# Patient Record
Sex: Male | Born: 1945 | Race: White | Hispanic: No | Marital: Single | Smoking: Never smoker
Health system: Southern US, Community
[De-identification: ages and names within clinical notes are randomized; demographics above are authoritative.]

---

## 2003-06-13 ENCOUNTER — Inpatient Hospital Stay (HOSPITAL_COMMUNITY): Admission: EM | Admit: 2003-06-13 | Discharge: 2003-06-23 | Payer: Self-pay | Admitting: Emergency Medicine

## 2003-06-13 ENCOUNTER — Encounter: Payer: Self-pay | Admitting: Emergency Medicine

## 2003-06-14 ENCOUNTER — Encounter: Payer: Self-pay | Admitting: Cardiology

## 2003-06-16 ENCOUNTER — Encounter: Payer: Self-pay | Admitting: Cardiology

## 2003-06-17 ENCOUNTER — Encounter (INDEPENDENT_AMBULATORY_CARE_PROVIDER_SITE_OTHER): Payer: Self-pay | Admitting: Cardiology

## 2003-06-17 ENCOUNTER — Encounter: Payer: Self-pay | Admitting: Cardiology

## 2003-06-18 ENCOUNTER — Encounter: Payer: Self-pay | Admitting: Cardiology

## 2003-06-19 ENCOUNTER — Encounter: Payer: Self-pay | Admitting: Cardiology

## 2008-07-20 ENCOUNTER — Encounter: Admission: RE | Admit: 2008-07-20 | Discharge: 2008-07-20 | Payer: Self-pay | Admitting: Cardiology

## 2008-07-23 ENCOUNTER — Encounter: Admission: RE | Admit: 2008-07-23 | Discharge: 2008-07-23 | Payer: Self-pay | Admitting: Cardiology

## 2008-07-24 ENCOUNTER — Inpatient Hospital Stay (HOSPITAL_COMMUNITY): Admission: RE | Admit: 2008-07-24 | Discharge: 2008-07-25 | Payer: Self-pay | Admitting: Cardiology

## 2008-09-29 ENCOUNTER — Observation Stay (HOSPITAL_COMMUNITY): Admission: AD | Admit: 2008-09-29 | Discharge: 2008-09-29 | Payer: Self-pay | Admitting: Cardiology

## 2009-09-11 IMAGING — CR DG CHEST 1V
1 series · 1 of 1 positions shown · non-contrast
Comparison: July 20, 2008

CLINICAL DATA: Question left lung nodule versus nipple

CHEST - 1 VIEW

[view not recorded]
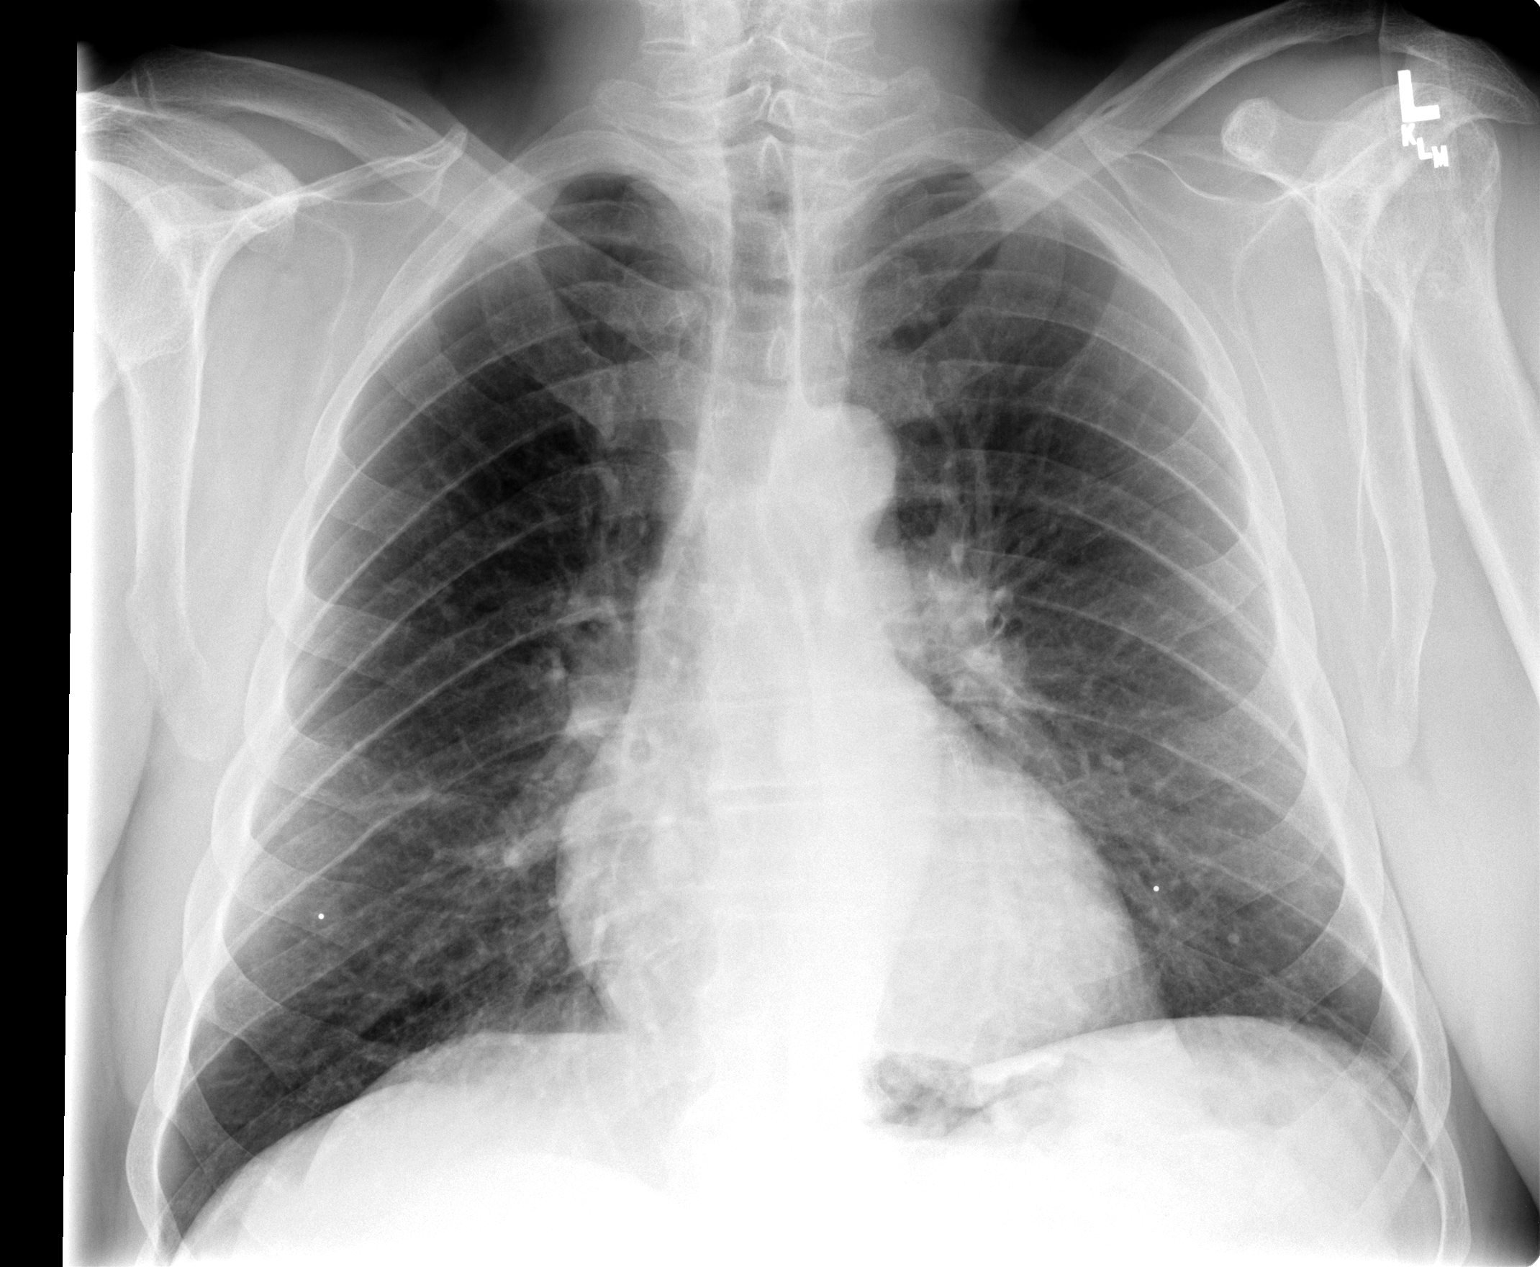

[1 of 1 positions shown; findings below may reference images not displayed]

FINDINGS: There is no evidence of persistent nodule in the lateral
left lung.  Both lungs are clear. The nipples are  marked with BBs.
The cardiac silhouette, mediastinum, pulmonary vasculature are
within normal limits.
IMPRESSION: Normal frontal chest x-ray.

## 2011-04-18 NOTE — Cardiovascular Report (Signed)
NAME:  COLSON, BARCO                   ACCOUNT NO.:  [id number]   MEDICAL RECORD NO.:  [id number]          PATIENT TYPE:  INP   LOCATION:  2899                         FACILITY:  [facility]   PHYSICIAN:  Cristy Hilts. Jacinto Halim, MD       DATE OF BIRTH:  05/11/1946   DATE OF PROCEDURE:  09/29/2008  DATE OF DISCHARGE:  09/29/2008                            CARDIAC CATHETERIZATION   PROCEDURE PERFORMED:  1. Coronary arteriography including right coronary and left coronary      arteriography.  2. Attempted percutaneous transluminal coronary angioplasty and      balloon angioplasty of chronic total occlusion of the circumflex      coronary artery.  3. Right femoral arteriography and closure of right femoral arterial      access with StarClose.   INDICATIONS:  Mr. Tymel Conely is a 65 year old gentleman with known  hypertension and hyperlipidemia who has had known coronary artery  disease.  He has had a PTCA and stenting of mid-LAD with implantation of  a 4.0 x 33 mm Velocity on June 21, 2003 and also a 3.0 x 13 mm Velocity  into the circumflex coronary artery on the same day.  He has also  undergone PTCA and stenting of right coronary artery in the distal  segment with implantation of a 4.0 x 16 mm Liberte and at the  bifurcation of the distal RCA a 3.5 x 12 mm Liberte on July 24, 2008.  He had been complaining of increasing angina pectoris and while doing it  noticed significant improvement in his angina since RCA angioplasty 2  months ago.  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  He has  known chronically totally occluded circumflex coronary artery which had  bridging collaterals and also contralateral collaterals from the RCA.  He is now brought back to the catheterization lab for a relook of his  right coronary artery anatomy and also for possible angioplasty of the  circumflex coronary artery.   ANGIOGRAPHIC DATA:  Right coronary artery:  Right coronary artery is a  large caliber vessel and  a dominant vessel.  It has got diffuse luminal  irregularity.  The previously placed stent in the mid-to-distal segment  of the right coronary artery and also the distal RCA prior to the  bifurcation of PDA and PLA are widely patent with brisk flow.  There  were type 2 collaterals noted to the distal circumflex coronary artery.   Left main coronary artery:  Left main coronary artery is mildly  calcified.   LAD:  LAD again shows mild-to-moderate calcification.  There is a  concentric 40-50% stenosis noted in the proximal LAD, which is unchanged  from cardiac catheterization in 2004.  The previously placed stent in  the mid LAD is widely patent.   Circumflex coronary artery:  Circumflex coronary artery is occluded in  its mid segment at the previously placed stent.  Distal circumflex is  supplied by collaterals from the RCA and also ipsilateral collaterals.   INTERVENTION DATA:  Unsuccessful attempt at PTCA of the CTO.  In spite  of  use of Whisper wire, Miracle Brothers 4.5, Voyager 2.0 x 12, and 1.5  x 12 balloon support for backup, I was unable to cross the CTO.  Eventually, when I did cross the CTO, I was subintimal and hence the  procedure was aborted.   There were no complications noted.  The patient remained completely  asymptomatic.  There was no staining neither was there any perforation.   RECOMMENDATIONS:  I will continue aggressive medical therapy only for  now.  If chest pain continues to persist, we can certainly attempt a  redo angioplasty again at a later date with the use of a FineCross  catheter.  However, I would prefer medical therapy at this point.  We  would also consider Ranexa therapy.   A total of 175 mL of contrast was utilized for diagnostic and  interventional procedure.   TECHNICAL PROCEDURE:  Under usual sterile precautions using a 7-French  right femoral artery access, a 6-French Multipurpose B2 catheter was  advanced to the ascending aorta and the  right coronary artery was  selectively engaged and angiography was performed.  The catheter was  then pulled out of the body and a 7-French Judkins left 4 guide catheter  was utilized, engaged in the left main coronary artery and angiography  was performed.  Using Angiomax for anticoagulation, a 190 cm x 0.014-  inch Whisper wire was utilized and the wire was carefully advanced into  the circumflex coronary artery.  After multiple attempts, I even used a  2.0 x 12 mm Voyager for backup support.  Because of side branch, I was  unable to cross the lesion and hence I used a Miracle Brothers 4.5 wire.  With great amount of difficulty, I was able to cross the lesion but I  was subintimal.  Hence, the Miracle Brothers wire was withdrawn and  again, I attempted to penetrate the CTO with the Whisper wire which was  successful, however, the tip of the wire appeared to be subintimal in  the distal circumflex coronary artery.  Hence, I aborted the procedure.  The wires were withdrawn out of the body angiographically and guide  catheter disengaged and pulled out of the body.   Right femoral arteriography was performed through the arterial access  sheath and access closed with StarClose with excellent hemostasis.  The  patient tolerated the procedure well.  No immediate complications were  noted.      Cristy Hilts. Jacinto Halim, MD  Electronically Signed     JRG/MEDQ  D:  09/29/2008  T:  09/30/2008  Job:  [id number]   cc:   Isabelle Course

## 2011-04-18 NOTE — H&P (Signed)
NAME:  Clayton Moore, Clayton Moore                   ACCOUNT NO.:  [id number]   MEDICAL RECORD NO.:  [id number]          PATIENT TYPE:  OIB   LOCATION:  2899                         FACILITY:  [facility]   PHYSICIAN:  Cristy Hilts. Jacinto Halim, MD       DATE OF BIRTH:  Sep 24, 1946   DATE OF ADMISSION:  07/24/2008  DATE OF DISCHARGE:                              HISTORY & PHYSICAL   REASON FOR VISIT:  A 65 year old patient of Dr. Verl Dicker is here today at  [facility] for elective cardiac catheterization with intervention.   HISTORY OF PRESENT ILLNESS:  A 66 year old white male with coronary  artery disease has been doing quite well with his disease until this  spring episodes of chest pressure in the middle of his chest with  exertional activity which is relieved with rest.  Does not use  nitroglycerin as is relieved with rest.  Has been constant and easily  reproducible.  No associated shortness of breath, palpitations, dyspnea  or orthopnea.  Because of these symptoms, he underwent a stress nuclear  study with Myoview and was found to have moderate ischemia in the mid  inferior lateral region.  EF was 36%.  Was considered high risk scan and  Dr. Jacinto Halim took him to [facility] for cardiac  catheterization.   Please note the patient also had a 2-D echo.  EF was mildly decreased,  by echo it was read 40-50% with severe hypokinesis of the inferior  posterior wall.   On July 15, 2008, he underwent cardiac catheterization at [facility] revealing severe progression of coronary disease.  The RCA had a  of 60-70% mid stenosis and now tandem 70% stenosis in the mid segment  and tandem stenosis in the mid segment involving the crux of the right  RCA.  There is a distal 80% focal stenosis just at the bifurcation of  the PDA and PLA.  Additionally, he has occluded circumflex artery stent  that was placed with MI in July 2004 and does have ipsilateral and  contralateral collaterals.  His previous LAD stent  was patent.  On  cardiac cath, EF was 40-45% with severe inferior and inferior lateral  hypokinesis.   The patient did well with cath and Dr. Jacinto Halim felt medical therapy for  now and would proceed with intervention to the RCA first.  If it is  successful, he plans to proceed to revascularize the circumflex coronary  artery which is stented.  If this does not work for either vessel,  either medical therapy versus bypass grafting will be recommended.   Since return home, the patient has not had any problems except for  continued cough which he had for several months now with a yellow mucus.   ALLERGIES:  No known allergies.   OUTPATIENT MEDICATIONS:  1. Metoprolol succinate 50 mg daily.  2. Urotraxal 10 mg daily.  3. Aspirin 325 daily.  4. Plavix 75 mg daily.  5. Niaspan 1000 mg 2 tablets p.o. h.s.  6. Testosterone cream three times a week.  7. Lisinopril 10 mg q.h.s.  SOCIAL HISTORY:  Divorced.  Two children, two grandchildren.  He  exercises by walking or had been two to three x a day.  Occasional  tobacco.  He has been instructed to cease tobacco use and occasional  alcohol use.   FAMILY HISTORY:  Without change from previous workups.   REVIEW OF SYSTEMS:  GENERAL:  No recent weight changes.  Has had a cough  now for several months.  Yellow sputum with negative chest x-rays.  SKIN:  Without rashes.  HEENT:  No sinus infections.  No teeth  infections.  CARDIOVASCULAR:  Chest pain as described.  PULMONARY: Cough  but no shortness of breath.  GI:  No diarrhea, constipation or melena.  GU:  No hematuria or dysuria.  MUSCULOSKELETAL:  No edema.  No painful  arthritis currently.  NEUROLOGICAL:  No syncope, no lightheadedness or  dizziness.  ENDOCRINE:  No diabetes or thyroid disease.   STUDIES:  Chest x-ray will be done August 20.  Normal frontal chest x-  ray.  Previous chest x-ray on the 17th revealed mild probable chronic  bronchitic changes, questionable nodular density  associated with a left  lung base versus nipple shadow.  So, therefore, we did a PA x-ray and  there was no evidence of nodule in the left lung.  There were nipple  markers used.  It was normal chest x-ray.  EKG with the stress test.  He  was sinus rhythm without acute changes and laboratory data on August 17.  Hemoglobin 15.2, hematocrit 42.9, WBC 5.5, platelets 195, INR/PTT were  within normal.  TSH 3.450, creatinine 1.3, BUN 14, glucose 100,  cholesterol 193, LDL 104 which is elevated, HDL 29, triglycerides 278.  UA was clear.   The patient had been on Advicor, but currently he is only on Niaspan.  In the paperwork I have, I do not have reactions to statins.  We may  need to revisit statins on this patient with significant coronary  disease, currently not on statin.  Dr. Jacinto Halim will discuss that after the  procedure.      Darcella Gasman. [place], N.P.      Cristy Hilts. Jacinto Halim, MD  Electronically Signed    LRI/MEDQ  D:  07/24/2008  T:  07/24/2008  Job:  [id number]   cc:   Cristy Hilts. Jacinto Halim, MD  Isabelle Course

## 2011-04-18 NOTE — Cardiovascular Report (Signed)
NAME:  Clayton Moore, Clayton Moore                   ACCOUNT NO.:  [id number]   MEDICAL RECORD NO.:  [id number]          PATIENT TYPE:  OIB   LOCATION:  6532                         FACILITY:  [facility]   PHYSICIAN:  Vonna Kotyk R. Jacinto Halim, MD       DATE OF BIRTH:  1946/08/20   DATE OF PROCEDURE:  07/24/2008  DATE OF DISCHARGE:                            CARDIAC CATHETERIZATION   PROCEDURE PERFORMED:  Percutaneous transluminal coronary angioplasty and  stenting of the mid and distal right coronary artery.   INDICATIONS:  Clayton Moore is a 62-year gentleman with known coronary  artery disease.  He had undergone PTCA and stenting of the circumflex  coronary artery in the setting of acute inferoposterior wall myocardial  infarction in 2004.  He also has a high-grade mid LAD stenosis which had  undergone angioplasty and stenting at the same time and with non-drug-  eluting stents.  He has been complaining of increasing angina pectoris  and had underwent stress Myoview which had revealed inferior and  inferolateral wall ischemia.  Cardiac catheterization done at [facility] about 10 days ago had revealed occluded circumflex coronary  artery but he had delayed progression of coronary artery disease in the  right coronary artery with 80% stenosis at the mid segment and a  bifurcation at PDA, PLA site of 80-90%.  He had extensive collaterals  from the right coronary artery to the circumflex coronary artery.   He also had a napkin ring-like lesion in the proximal LAD initially was  planning on doing intravascular ultrasound interrogation of this LAD.  However, on review of the angiograms in 2004 revealed no change and also  there was no anterior wall ischemia by stress Myoview, hence this lesion  was left alone.  The plan today was to proceed with a PTCA and stenting  of the right coronary artery in the hope of increasing the collateral  flow to the circumflex coronary artery with relief of angina pectoris.  This was a large dominant right coronary artery.   Right RCA.  The right coronary artery is a large caliber vessel with  mild diffuse luminal irregularity.  In the mid segment, he has 80%  stenosis which was tubular and in the distal segment, the bifurcation  had a 80% stenosis.  The PDA was small to moderate size and PLA was very  large caliber vessel.   INTERVENTION DATA:  Successful PTCA and stenting of the distal right  coronary artery after balloon angioplasty and predilatation with a 3.25  x 10-mm cutting balloon.  A 3.5 x 12 mm Liberte stent was deployed at 18  atmospheric pressure (3.9 mm) for 50 seconds each.  Post angioplasty  revealed excellent results without any jeopardy of the side branch.   Successful PTCA and stenting of the mid segment of the right coronary  artery with implantation of a 4.0 x 16 mm Liberte stent which was  deployed at 15 atmospheric pressure x2 for 50 seconds, post angiography  revealed excellent results.   RECOMMENDATIONS:  We will continue with aggressive medical therapy.  At  this point, no intervention is planned for circumflex coronary artery or  re-look at the LAD.  At the previously outpatient cardiac  catheterization, there were widely patent LAD stent.  We noticed  significant improvement in his anginal symptoms with today's procedure.  He will need Plavix for at least a period of 6 weeks, probably I will  leave him on for at least a period of 3-6 months.  He will be discharged  home in the morning if he remains stable.   A total of 160 mL of contrast was utilized for interventional procedure.  Right femoral access was closed with StarClose with excellent  hemostasis.   TECHNIQUE OF PROCEDURE:  Under usual sterile precautions using a 7-  French right femoral arterial access a 7-French JR-4 guide with side-  hole was utilized to engage the right coronary artery.  Using Angiomax  for anticoagulation and ATW guidewire, I was able to measure  the lesion  length.  I had difficulty in cannulating the PDA branch as it had acute  angulation.  I predilated the distal and also mid RCA stenosis with a  3.25 x 10-mm cutting balloon at 10 atmospheric pressure for 90 seconds  each.  Having performed this, angiography revealed a residual stenosis  and this was a tight lesion, especially at the bifurcation of the distal  RCA.  I decided to stent this with 3.5 x 12 mm Liberte stent which was  deployed at 18 atmospheric pressure and nitroglycerin was administered  and angiography was repeated.  The attention was directed towards mid  segment which was also stented with a 4.0 x 16 mm Liberte stent at 15  atmospheric pressure x2 for 50 seconds each.  Overall, the stenosis was  reduced from 80% to 0% with TIMI III to TIMI III flow maintained at the  end of the procedure.  The guidewire was withdrawn and angiography  repeated.  The guide catheter disengaged and pulled out of the body.  Right femoral arteriography was performed through the arterial access  sheath and the access closed with StarClose with excellent hemostasis.  The patient tolerated the procedure.      Cristy Hilts. Jacinto Halim, MD  Electronically Signed     JRG/MEDQ  D:  07/24/2008  T:  07/25/2008  Job:  [id number]   cc:   Emilio Aspen, MD

## 2011-04-21 NOTE — Cardiovascular Report (Signed)
NAME:  Clayton Moore, Clayton Moore                               ACCOUNT NO.:  [id number]   MEDICAL RECORD NO.:  [id number]                   PATIENT TYPE:  INP   LOCATION:  2920                                 FACILITY:  [facility]   PHYSICIAN:  Cristy Hilts. Jacinto Halim, M.D.                  DATE OF BIRTH:  06-17-1946   DATE OF PROCEDURE:  06/13/2003  DATE OF DISCHARGE:                              CARDIAC CATHETERIZATION   PROCEDURES PERFORMED:  1. Left ventriculography.  2. Selective right and left coronary arteriography.  3. Percutaneous transluminal coronary angioplasty and stenting of the     circumflex coronary artery.  4. Intracoronary nitroglycerin and intracoronary administration of     adenosine.  5. Percutaneous insertion of intra-aortic balloon pump.  6. Administration of intravenous amiodarone for nonsustained ventricular     tachycardia.   INDICATIONS:  Clayton Moore is a 65 year old gentleman with a history of  smoking about two packs of cigarettes per day, otherwise no known past  medical history, who was admitted through the emergency room after he was  seen in the urgent care for chest pain with ECG abnormalities.  In the  emergency room his ST segment depression in the lateral leads was back to  baseline; however, he developed recurrent chest pain and at that time he  developed ST segment elevation in the inferior leads and lateral leads.  Given this, he was brought emergently to the cardiac catheterization lab to  evaluate his coronary anatomy with the diagnosis of acute myocardial  infarction in the inferior wall.   HEMODYNAMIC DATA:  1. The left ventricular pressures was 95/13 with end-diastolic pressure of     21 mmHg.  2. The aortic pressure was 93/65 with a mean of 79 mmHg.  3. There was no pressure gradient across the aortic valve.   ANGIOGRAPHIC DATA:  1. Left ventricle:  The left ventricular systolic function was mildly     depressed with ejection fraction of 45-50% with mild  mid- to distal     anterolateral hypokinesis and posterolateral hypokinesis.  2. Right coronary artery:  The right coronary artery is a very large-caliber     vessel.  It gives origin to a large PLV and a moderate-sized PDA.  It in     the mid segment has luminal irregularity constituting 60-70% stenosis.     There is diffuse luminal irregularity.  The RCA appears to be co-dominant     with circumflex.  3. Left main coronary artery:  The left main coronary artery is a large-     caliber vessel.  It bifurcates into LAD and circumflex.  4. Circumflex coronary artery:  The circumflex coronary artery is a very     large-caliber vessel.  It gives origin to a large obtuse marginal 1 and     continues as a distal circumflex after giving a small  second obtuse     marginal.  It appears to be co-dominant with the right coronary artery.     There is a subtotal occlusion of the mid-circumflex that extends from the     mid segment up to the bifurcation of a large obtuse marginal 1 and distal     circumflex.  There was dye hang-up in the distal circumflex coronary     artery.  There was TIMI-0 to TIMI-1 flow noted in these vessels.  5. Left anterior descending artery:  The left anterior descending is again a     large-caliber vessel, in the proximal segment gives origin to a moderate-     sized diagonal 1.  At the proximal site there is mild haziness with 40-     50% luminal narrowing.  In the mid segment of the LAD has an 80%     irregular probably ulcerated mid-LAD stenosis.  The LAD gives origin to a     moderate-sized diagonal 1 and diagonal 2.  It wraps around the apex.    IMPRESSION:  1. Mild decrease in left ventricular systolic function with mid- to distal     anterolateral and posterolateral hypokinesis, with ejection fraction of     45 to at most 50%.  2. A 70% stenosis at the mid-right coronary artery.  3. Subtotaled circumflex in its mid segment at the mid segment and extending     into  the bifurcation of a large obtuse marginal 2 and distal circumflex,     which are very large-caliber vessels, and the circumflex is probably co-     dominant with the right coronary artery.  4. A 40% proximal left anterior descending artery haziness, at most 50%     stenosis, with mid-90% irregular lumen in the mid-LAD, which appears to     be probably ulcerated.   RECOMMENDATIONS:  Will proceed with attempted rescue angioplasty of the  circumflex coronary artery with the hope of sending him for bypass surgery.   INTERVENTION DATA:  A  very difficult procedure.  Difficulty to cross the  circumflex lesion.  Multiple guidewires were utilized, including ATW marker  wire, PT Graphics, Cross It wire.  Multiple balloon inflations of 3.0 x 20  mm balloon with no establishment of flow in the distal bed of the  circumflex.  Sections of PTCA and stent of the mid circumflex with a 3.0 x  13 mm BX Velocity heparin-coated stent.  The circumflex lesion was reduced  from 100% to 0% with the establishment of TIMI-3 flow in the circumflex and  in the obtuse marginal-2; however, TIMI-1 to 2 flow was established in the  large distal circumflex.  Unable to pass into the distal circumflex in spite  of multiple attempts with multiple wires.  There was still residual thrombus  noted in the obtuse marginal 2 with mild haziness at the bifurcation of the  distal circumflex and large obtuse marginal 2.   RECOMMENDATIONS:  The patient was chest pain free and intra-aortic balloon  pump was electively inserted.  The patient will be continued on Integrilin  for 24-48 hours.  We plan to proceed with re-look of his coronary arteries.  He will need further intervention of his LAD.  The lesion on the right  ostium appears to be stable and is old, and the mid LAD lesion will need to  be revascularized.  The anatomy is also feasible for PCI.  Given the fact that I was able to  establish flow into the large obtuse marginal 1  and his  TIMI-2 flow into the distal circumflex, will continue aggressive medical  therapy unless he has recurrent chest pain or has hemodynamic compromise;  then he may need emergent bypass surgery.  I did discuss the issues with  Salvatore Decent. Cornelius Moras, M.D., who was on call when I was having difficulty in  passing the wire.  As I was able to pass the wire into the distal  circumflex, we proceeded with the rescue angioplasty.   TECHNIQUE OF THE PROCEDURE:  Under usual sterile precautions using the 6  French right femoral artery access, a 6 [place] multipurpose B2 catheter was  advanced to the ascending aorta with a 0.035 inch J-wire.  The catheter was  then placed in the left ventricle and left ventricular pressures were  monitored.  Hand contrast injection in the left ventricle was performed both  in LAO and RAO projection.  The catheter was flushed with saline, pulled  back, and the pressure gradient across the aortic valve was monitored.  The  right coronary artery was selectively engaged and angiography was performed.  In a similar fashion, the left main coronary artery was selectively engaged  and angiography was performed.  Then the catheter was pulled out of the body  in the usual fashion.  Then an 8 [place] FL-4 guide was advanced into the  ascending aorta over the J-wire and the left and right coronary arteries  were selectively engaged.  Then a 10 cm x 0.014 inch ATW Marker guidewire  was advanced after multiple failed attempts at crossing the circumflex.  A  3.0 x 20 mm CrossSail balloon was advanced over the same guidewire and the  wire was again attempted to cross into the distal circumflex.  Because of  inability to cross into the lesion even with the balloon support, a PT  Graphics wire was cerclaged and buddy-wiring was performed.  In spite of  this, it was unable to cross into the distal circumflex.  Hence, a 190 cm x  0.014 inch Cross It 200 guidewire was then utilized and  again buddy-wiring  was performed on the PT Graphics.  The wire then crossed into a small  secondary branch distal to the obtuse marginal 2.  The 3.0 x 20 mm balloon  was then positioned between the circumflex and balloon angioplasty was  performed at 4 atmospheres pressure for 40 seconds, then at 6 atmospheres  pressure for 45 seconds.  There was no establishment of flow in the distal  circumflex coronary artery.  Hence, the guidewire was again attempted to be  placed into the major vascular bed and with great difficulty I was able to  advance into the large obtuse marginal 2.  Then after positioning the wire  into the large obtuse marginal 2, a 3.0 x 13 mm heparin-coated BX Velocity  stent was advanced into the circumflex coronary artery and the stent was  positioned distal to the bifurcation of a large obtuse marginal 2 and distal  circumflex, carefully noting to avoid any compromising the lumen.  The stent was deployed at 12 atmospheres pressure for 33 seconds, then at 16 for 22,  and then at 10 atmospheres of pressure for 20 seconds.  Then the balloon was  deflated and the last second inflation was again performed at 16 atmospheres  of pressure for 45 seconds.  10 mL of intracoronary Integrilin was also  administered.  During the procedure, because of slow flow,  16 mcg of  intracoronary adenosine and 200 mcg of intracoronary nitroglycerin were also  administered.  Repeat angiography was performed.  There was establishment of  TIMI-3 flow in the obtuse marginal 2 vessel and in the mid circumflex;  however, the distal circumflex had only TIMI-2 flow.  The patient at this  point had remained chest pain free.  Hence, the procedure was terminated at  this time and intra-aortic balloon pump was introduced percutaneously and  the balloon pump positioning was confirmed by fluoroscopy.  During the  middle of the angioplasty, patient did develop nonsustained ventricular  tachycardia with a drop  in his pressure.  Emergent 150 mg of intravenous  amiodarone was administered.  At the end of the procedure, the patient  remained hemodynamically stable on the balloon pump with systolics of 90  mmHg with augmentation to 110 mmHg.  Patient was then transferred to the  intensive care unit in stable condition.                                               Cristy Hilts. Jacinto Halim, M.D.    Pilar Plate  D:  06/16/2003  T:  06/17/2003  Job:  [id number]

## 2011-04-21 NOTE — Discharge Summary (Signed)
NAME:  [name], Clayton Moore                   ACCOUNT NO.:  [id number]   MEDICAL RECORD NO.:  [id number]          PATIENT TYPE:  INP   LOCATION:  6532                         FACILITY:  [facility]   PHYSICIAN:  Cristy Hilts. Jacinto Halim, MD       DATE OF BIRTH:  10/13/1946   DATE OF ADMISSION:  07/24/2008  DATE OF DISCHARGE:  07/25/2008                               DISCHARGE SUMMARY   DISCHARGE DIAGNOSES:  1. Coronary artery disease with elective right coronary artery Liberte      stenting on this admission.  2. Prior coronary disease with previous circumflex and left anterior      descending stent placement in July 2004.  3. Treated dyslipidemia.  4. Treated hypertension   HOSPITAL COURSE:  The patient is a 65 year old male who has had coronary  disease.  He had previous circumflex and LAD stenting in July 2004.  Recently, he had had chest pain as an outpatient and underwent Myoview  study.  This was abnormal with inferior ischemia.  He was set up for  catheterization, which was done the heart center.  This revealed an  occluded circumflex stents with collaterals but had a patent LAD stent.  The RCA had an 80% mid lesion and 80% distal lesion prior to the  bifurcation of the PDA and PLA.  He was admitted for elective  intervention on July 25, 2008, which he tolerated well.  He had a  Liberte stent placed.  I should note that his EF was 40-45% at cath as  an outpatient.  There were some collaterals to the circumflex from the  RCA as noted at angiogram.  The patient was discharged on July 25, 2008.   DISCHARGE MEDICATIONS:  1. Metoprolol 50 mg b.i.d.  2. Aspirin 325 mg a day.  3. Lisinopril 10 mg a day.  4. Uroxatral 10 mg a day.  5. Niaspan 2 g nightly.  6. Nitroglycerin sublingual p.r.n.  7. Plavix 75 mg a day.  8. Testosterone cream is taken at home, 3 times a week.   LABORATORY:  EKG shows sinus rhythm with inferior Qs.  White count 7.7,  hemoglobin 14.4, hematocrit 41.8, and platelets 176.   Sodium 138,  potassium 4.4, BUN 12, and creatinine 1.18.  CK-MB and troponin are  negative.   DISPOSITION:  The patient is discharged in stable condition and will  follow up with Dr. Jacinto Halim as an outpatient.      Abelino Derrick, P.A.      Cristy Hilts. Jacinto Halim, MD  Electronically Signed    LKK/MEDQ  D:  09/03/2008  T:  09/04/2008  Job:  [id number]   cc:   [facility]

## 2011-04-21 NOTE — Cardiovascular Report (Signed)
NAME:  Clayton Moore, Clayton Moore                               ACCOUNT NO.:  [id number]   MEDICAL RECORD NO.:  [id number]                   PATIENT TYPE:  INP   LOCATION:  6531                                 FACILITY:  [facility]   PHYSICIAN:  Cristy Hilts. Jacinto Halim, M.D.                  DATE OF BIRTH:  11/20/1946   DATE OF PROCEDURE:  06/21/2003  DATE OF DISCHARGE:                              CARDIAC CATHETERIZATION   PROCEDURE PERFORMED:  1. Left ventriculography.  2. Selective right and left coronary arteriography.  3. Intravascular ultrasound interrogation of left anterior descending     artery.  4. Percutaneous transluminal coronary angioplasty and stenting of the mid     left anterior descending artery.   INDICATION:  Clayton Moore is a 65 year old gentleman who was admitted to  the hospital on June 13, 2003 with an acute posterior lateral myocardial  infarction for which he underwent emergent PCI with partial success in  opening up his large obtuse marginal 1.  However, he had severe stenosis in  his large obtuse marginal 2.  He had a rough course in the hospital  including hypotensive episodes, urosepsis and also patient requiring intra-  aortic balloon pump support during initial evaluation.  His blood pressure  continues to be on the borderline range and he had a high grade lesion in  his mid LAD.  Hence, he was brought back to the cardiac catheterization  lab  to re-evaluate his coronary anatomy and also for possible PCI of his  anterior descending artery.   HEMODYNAMIC DATA:  1. The left ventricular pressures were 97/9 with end-diastolic pressure of     22 mmHg.  2. Aortic pressure 101/67 with a mean of 83 mmHg.  3. There was no pressure gradient across the aortic valve.   ANGIOGRAPHIC DATA:  Left ventricle:  Left ventricular systolic function was  mild to moderately depressed with ejection fraction estimated around 45%  with severe posterior lateral hypokinesis and mid to distal  anterolateral  hypokinesis.  There is no significant mitral regurgitation.   Right coronary artery:  The right coronary artery is a large caliber vessel.  It has a mid 60-70% luminal irregularity and 20-30% distal RCA stenosis  which is unchanged from cardiac catheterization on June 13, 2003.   Left main coronary artery:  Left main coronary artery is a large caliber  vessel. It has diffuse calcification and it has ostial 10-20% stenosis.   Circumflex coronary artery:  Circumflex coronary artery is a large caliber  vessel.  It gives origin to large obtuse marginal 1 and continues as large  obtuse marginal 2.  The OM-1 is widely patent.  The ostial OM-2 has 70-80%  stenosis.  There is heavy calcification noted at its bifurcation.  The  previously placed 3.0 x 13-mm BX Velocity stent in the mid circumflex on  June 13, 2003  is widely patent.   Left anterior descending artery:  Left anterior descending artery is a large  caliber vessel in the proximal segment.  It gives origin to small diagonal  #1.  It gives large septal perforator.  Right at the origin of the septal  perforator and diagonal #1, there is a hazy 30-40% luminal stenosis followed  by an 80-90% mid LAD stenosis.  The LAD wraps around the apex.  There is  mild luminal irregularity throughout.   IVUS DATA:  There was 70-80% stenosis noted with the vessel lumen area of  1.9 sq mm in the tightest spot in the mid LAD.  The lesion length measured  29 mm.  The proximal LAD stenosis was only very mild.  The left main had  mild disease.   INTERVENTION DATA:  Successful percutaneous transluminal coronary  angioplasty and stenting of the mid LAD with a 4.0 x 33-mm heparin coated BX  Velocity stent deployed at 16 atmospheric pressure.  This stent was post  dilated with its 4.5 x 20-mm Quantum Maverick at 12 atmospheric pressure.  The stenosis was reduced from 80% to 0% with TIMI-3 to TIMI-3 flow  maintained at the end of the procedure.   Post procedure IVUS interrogation  revealed excellent wall opposition of the stent.   The patient  did have chest pain, belching and ST segment elevation during  initial IVUS interrogation prior to angioplasty.   RECOMMENDATIONS:  The patient will be kept on Plavix indefinitely.  Home  probably in one to two days.  Outpatient Cardiolite stress test to evaluate  the significance of RCA stenosis and also to establish a baseline for his  obtuse marginal disease.  Cardiac rehabilitation, smoking cessation.  Slowly  will try to initiate ACE inhibitor and beta blocker therapy.   TECHNIQUE OF PROCEDURE:  Under usual sterile precautions, using a 6 French  right femoral arterial access, a 6 [place] multipurpose pigtail catheter was  advanced into the ascending aorta over a 0.035-inch J wire.  The catheter  was then gently advanced to the left ventricle and left ventricular  pressures were monitored.  Hand contrast injection of the left ventricle was  performed both in the LAO and RAO projection.  The catheter was flushed with  saline and pulled back into the ascending aorta and pressure gradient across  the aortic valve was monitored.  The right coronary artery was selectively  engaged and angiography was performed.  In a similar fashion, the left main  coronary artery was selectively engaged and angiography was performed.  Then, the catheter was pulled out of the body in the usual fashion.   TECHNIQUE OF INTERVENTION:  The 6 French sheath was exchanged for an 8  [place] sheath.  Then, an 77 [place] FL-4 guide was advanced into the ascending  aorta with 0.035-inch J wire.  Angiomax was utilized for anticoagulation.  Then, a 180 cm x 0.014-inch ATW guide wire was utilized to cross the left  anterior descending artery and lesion length was carefully measured with  marker wire.  The tip of the wire was carefully placed in the distal left  anterior descending artery.  Then, the Galaxy IVUS catheter was  advanced over the same guide wire and IVUS interrogation was performed and the data  was carefully analyzed.  Because of pain and ST segment elevation during  initial IVUS interrogation, a 3.0 x 20-mm Maverick balloon was utilized to  perform angioplasty at 16 atmospheric pressure max for 30 seconds.  This was  done for initial stabilization while evaluation was continued.  Then, a 4.0  x 33-mm BX Velocity heparin coated stent was advanced over the same guide  wire and after confirming the position of the stent the stent was deployed  at 16 atmospheric pressure for 70 seconds.  The balloon was deflated and  pulled back into the guiding catheter and arteriography was performed.  Excellent results were noted.  This angioplasty site was, again, post  dilated with 4.5 x 20-mm Quantum balloon at 12 atmospheric pressure x2 for  47 and 50 seconds.  Then, the balloon was deflated and pulled back in the  guiding catheter.  Arteriography was performed.  Then, the balloon was  pulled out of the body.  The IVUS catheter was readvanced over the same  guide  wire and IVUS interrogation was performed post procedure.  Excellent wall  opposition was noted.  Then, the guide wire and IVUS catheter was removed  out of the body, angiography performed and the guide catheter was disengaged  and pulled out of the body in the usual fashion.  The patient tolerated the  procedure well.                                                 Cristy Hilts. Jacinto Halim, M.D.    Pilar Plate  D:  06/22/2003  T:  06/22/2003  Job:  [id number]  [facility]   cc:   [facility]

## 2011-04-21 NOTE — H&P (Signed)
NAME:  Clayton Moore, Clayton Moore                               ACCOUNT NO.:  [id number]   MEDICAL RECORD NO.:  [id number]                   PATIENT TYPE:  INP   LOCATION:  2920                                 FACILITY:  [facility]   PHYSICIAN:  Cristy Hilts. Jacinto Halim, M.D.                  DATE OF BIRTH:  1946-05-26   DATE OF ADMISSION:  06/13/2003  DATE OF DISCHARGE:                                HISTORY & PHYSICAL   CHIEF COMPLAINT:  Chest pain.   HISTORY OF PRESENT ILLNESS:  Clayton Moore is a 65 year old male with no prior  history of coronary disease who was admitted to the emergency room at [facility]  as a transfer from [facility].  The patient was raking hay this a.m.  He stopped for lunch.  In the afternoon he went to resume and developed  shortness of breath, jaw pain, and bilateral arm pain.  He denies any  diaphoresis.  He did have nausea, but no vomiting.  He was short of breath.  His symptoms were improved with nitroglycerin, but his blood pressure  dropped and his nitroglycerin was stopped.  His EKG shows transient septal  ST depression in the V2 and V3 leads.   PAST MEDICAL HISTORY:  1. Unremarkable for diabetes or hypertension.  His cholesterol status is     unknown.  2. He has had knee surgery in the past.   MEDICATIONS:  He is on no current medications.   ALLERGIES:  No known drug allergies.   SOCIAL HISTORY:  He is a one to two pack a day smoker.  He is divorced.  He  has two children and two grandchildren.  He occasionally uses alcohol.   FAMILY HISTORY:  His mother is alive at 66.  His father died at 78 of a  stroke.  He has one brother without coronary disease.   REVIEW OF SYSTEMS:  He does admit to some trouble starting voiding for the  last six months.  He denies any peptic ulcer disease or GI bleeding.  He has  no history of thyroid problems, liver trouble.  Review of systems otherwise  unremarkable except as noted above.   PHYSICAL EXAMINATION:  VITAL SIGNS:  Blood pressure  107/64, pulse 67,  respirations 16.  GENERAL:  He is a well-developed, well-nourished male in no acute distress.  HEENT:  Normocephalic.  Extraocular movements are intact.  Sclerae is  nonicteric.  Lids and conjunctivae are within normal limits.  NECK:  Without JVD and without bruits.  CHEST:  Clear to auscultation and percussion.  CARDIAC:  Regular rate and rhythm without murmur, rub, or gallop, normal S1  and S2.  ABDOMEN:  Nontender, no hepatosplenomegaly, bowel sounds are present.  EXTREMITIES:  Without edema.  Pulses are 2+/4 bilaterally.  There are no  femoral bruits noted.  NEUROLOGIC:  Grossly intact.  He is awake, alert  and oriented, and  cooperative.  He moves all extremities without obvious deficits.  SKIN:  Warm and dry.   LABORATORY DATA:  EKG shows sinus rhythm with transient ST depression in V2  and V3.  Initial labs show a CK of 454, MB and troponin are pending.  White  count 10.8, hemoglobin 15.1, hematocrit 42.6, platelets 178.  Sodium 139,  potassium 4.2, BUN 12, creatinine 1.5.  INR 0.9.  Chest x-ray is pending.   IMPRESSION:  1. Unstable angina, suspect ST myocardial infarction.  2. History of smoking.  3. Renal insufficiency, creatinine of 1.5.   PLAN:  The patient is on IV heparin and has received aspirin.  We will hold  off on beta blocker as he is somewhat bradycardic at rest.  We will try and  resume IV nitroglycerin pending his blood pressure.  He may need Integrelin;  this will be discussed with Dr. Jacinto Halim.      Clayton Moore, P.A.                      Cristy Hilts. Jacinto Halim, M.D.    Clayton Moore  D:  06/13/2003  T:  06/14/2003  Job:  [id number]

## 2011-04-21 NOTE — Discharge Summary (Signed)
NAME:  Clayton Moore, Clayton Moore                               ACCOUNT NO.:  [id number]   MEDICAL RECORD NO.:  [id number]                   PATIENT TYPE:  INP   LOCATION:  6531                                 FACILITY:  [facility]   PHYSICIAN:  Cristy Hilts. Jacinto Halim, M.D.                  DATE OF BIRTH:  1946-07-19   DATE OF ADMISSION:  06/13/2003  DATE OF DISCHARGE:  06/23/2003                                 DISCHARGE SUMMARY   DISCHARGE DIAGNOSES:  1. Coronary artery disease status post acute myocardial infarction treated     with percutaneous transluminal coronary angioplasty and stenting of the     circumflex artery on 06/13/2003.  2. Status post percutaneous transluminal coronary angioplasty and stenting     of the left anterior descending artery on 06/21/2001.  3. Residual right coronary artery disease.  4. Hypertension, now hypotensive, stable.  5. Status post nonsustained ventricular tachycardia treated with IV     amiodarone, now off of amiodarone drip.  6. Tobacco.  7. 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  8. Mild left ventricular dysfunction.  9. Urinary tract infection complicated by septic reaction.  10.      Right renal cyst as revealed on CT of the abdomen and pelvis.   HISTORY OF PRESENT ILLNESS:  This is a 65 year old gentleman with a history  of smoking about two packs of cigarettes per day and otherwise no known past  medical history.  He as admitted through the emergency room with complaints  of chest pain, shortness of breath, and EKG abnormalities.   HOSPITAL COURSE:  In the emergency room, his EKG showed ST depression in the  lateral leads but then came back to baseline.  Later the patient developed  recurrent chest pain and at that time developed ST elevation in inferior and  lateral leads.  He as taken straight to the catheterization lab for  emergency angiography.  This angiography revealed a high-grade lesion in the  circumflex artery, 40%  bifurcation lesion in the LAD at the place of  diagonal-1 takeoff, and 90% stenotic lesion in mid LAD.  The patient also  had 60 to 70% lesion in the RCA and 90% A-V groove circumflex, and it was  impossible to cross the wire.  Dr. Jacinto Halim performed angioplasty and stenting  of the mid circumflex stenotic lesion above obtuse marginal #2 with  reduction of the lesion from 100% to 0%.  The patient tolerated procedure  well and was transferred to the telemetry unit.   First set of cardiac enzymes on July 10 showed CK 454, MB 21, and troponin  0.38.  The next morning after catheterization, CK rose up to 3814, CK-MB  351.1, and troponin 67.25.  Second set was slightly better.  CK 2846, CK-MB  289.0, troponin 43.77.  Third set on July 11 showed CK 1775, CK-MB 130, and  troponin 7.26.   Cardiac panel prior to catheterization on July 19 showed on June 21, 2003,  CK  26, CK-MB 2.1, and troponin 0.67.   During the next day, the patient developed UTI infection, was started on IV  ciprofloxacin, also became febrile.  Urine culture was positive for multiple  species, but patient was symptomatic.  Symptoms were relieved with IV  ciprofloxacin.   On June 22, 2003, the patient underwent another heart catheterization with  intervention to the LAD and reduction of the lesion from 80% to 0% with  angioplasty and stenting.  There were no complications to this procedure.   On the morning after catheterization, potassium was 4.2, sodium 138,  chloride 106, CO2 24, glucose 90, BUN 9, and creatinine 1.3.  Hemoglobin  11.8, hematocrit 34.2, platelet count 228, and white blood cell count 5.8.   Lipid profile showed cholesterol 138, triglycerides 368, HDL 22, and LDL 74.  TSH was within normal limits at 1.568.  Hemoglobin A1C 5.1.   The patient maintained sinus rhythm for the rest of his admission, and his  groin did not reveal any signs of hematoma, bleeding, oozing, or ecchymosis.  Dr. Allyson Sabal addressed  patient at the time of discharge and found him to be  stable for discharge.  The patient was discharged home with following  recommendations.   He is not to drive or engage in any strenuous activity.  He is not to lift  greater than 5 pounds until seen by Dr. Jacinto Halim in the office.  Diet is to be  low-fat, low-cholesterol diet.  He is to report any problems to our office  and number provided.   DISCHARGE MEDICATIONS:  1. Plavix 75 mg daily indefinitely.  2. Protonix 40 mg daily.  3. Cipro 500 mg b.i.d. for 2 weeks.  4. Aspirin 81 mg daily.  5. Zocor 80 mg daily.   FOLLOW UP:  The patient will have Cardiolite stress test on 07/03/2003, in  our office at 10:15.  Followup appointment with Dr. Jacinto Halim on July 14, 2003, at 11:45.       Raymon Mutton, P.A.                    Cristy Hilts. Jacinto Halim, M.D.    MK/MEDQ  D:  06/23/2003  T:  06/23/2003  Job:  [id number]

## 2011-04-21 NOTE — Discharge Summary (Signed)
NAME:  Clayton Moore, Clayton Moore                   ACCOUNT NO.:  [id number]   MEDICAL RECORD NO.:  [id number]          PATIENT TYPE:  INP   LOCATION:  2899                         FACILITY:  [facility]   PHYSICIAN:  Cristy Hilts. Jacinto Halim, MD       DATE OF BIRTH:  June 19, 1946   DATE OF ADMISSION:  09/29/2008  DATE OF DISCHARGE:  09/29/2008                               DISCHARGE SUMMARY   DISCHARGE DIAGNOSES:  1. Coronary artery disease with elective attempt at a totally occluded      circumflex stent this admission that was unsuccessful, plan is for      continued medical therapy.  2. Dyslipidemia.  3. Known coronary artery disease with prior left anterior descending      stenting in July 2004 and right coronary artery stenting in August      2009, low sites were patent this admission.   HOSPITAL COURSE:  The patient is a 65 year old male followed by Dr.  Jacinto Halim with a history of coronary artery disease as described above.  He  was recently cathed at the [facility]: [facility] because of chest pain.  This  revealed restenosis of the patient's circumflex stent and a 70% RCA  stenosis with an EF of 40-45%.  The patient was set up for outpatient  admission for elective intervention.  When he was cathed on September 29, 2008, the RCA was patent as well as the RCA stents.  There was right-to-  left collaterals and left-to-left collaterals.  The circumflex stent was  occluded.  There was an unsuccessful attempt at opening this by Dr.  Jacinto Halim and the plan is for medical therapy.  He was finished with a  [employer] and sent home at around 6 p.m.   DISCHARGE MEDICATIONS:  1. Uroxatral 10 mg a day.  2. Pravachol 40 mg a day.  3. Plavix 75 mg a day.  4. Lisinopril 10 mg a day.  5. Metoprolol 25 mg a day.  6. Niaspan 1 g nightly.  7. Aspirin 325 mg a day.   DISPOSITION:  The patient is discharged in stable condition and will  follow up with Dr. Jacinto Halim as an outpatient.  Preop labs done on September 25, 2008, show a white count of  6.7, hemoglobin 15.3, hematocrit 42.7,  and platelet count 200.  INR is 0.9.  TSH 1.34.  BUN 13, creatinine 1.0,  sodium 140, and potassium 4.1.  Liver functions are normal.  Lipids show  cholesterol 149, triglycerides 163, HDL 26, and LDL 93.  UA was  negative.  EKG shows sinus rhythm and sinus bradycardia without acute  changes.   DISPOSITION:  The patient is discharged same day after an unsuccessful  attempt at intervention to an occluded circumflex stent.  Plan will be  for continued medical therapy.  He had good right-to-left and some left-  to-left collaterals.      Abelino Derrick, P.A.      Cristy Hilts. Jacinto Halim, MD  Electronically Signed    LKK/MEDQ  D:  11/19/2008  T:  11/20/2008  Job:  [id number]  cc:   [facility]

## 2015-07-27 DIAGNOSIS — M4712 Other spondylosis with myelopathy, cervical region: Secondary | ICD-10-CM | POA: Diagnosis not present

## 2015-07-27 DIAGNOSIS — F17210 Nicotine dependence, cigarettes, uncomplicated: Secondary | ICD-10-CM | POA: Diagnosis not present

## 2015-07-27 DIAGNOSIS — M4802 Spinal stenosis, cervical region: Secondary | ICD-10-CM | POA: Diagnosis not present

## 2015-11-29 DIAGNOSIS — J189 Pneumonia, unspecified organism: Secondary | ICD-10-CM | POA: Diagnosis not present

## 2017-09-10 DIAGNOSIS — R0600 Dyspnea, unspecified: Secondary | ICD-10-CM | POA: Diagnosis not present

## 2017-09-10 DIAGNOSIS — R05 Cough: Secondary | ICD-10-CM | POA: Diagnosis not present

## 2017-09-10 DIAGNOSIS — J069 Acute upper respiratory infection, unspecified: Secondary | ICD-10-CM | POA: Diagnosis not present

## 2017-09-10 DIAGNOSIS — R0602 Shortness of breath: Secondary | ICD-10-CM | POA: Diagnosis not present

## 2020-05-29 ENCOUNTER — Other Ambulatory Visit: Payer: Self-pay

## 2020-05-29 DIAGNOSIS — E860 Dehydration: Secondary | ICD-10-CM | POA: Insufficient documentation

## 2020-05-29 DIAGNOSIS — N400 Enlarged prostate without lower urinary tract symptoms: Secondary | ICD-10-CM | POA: Insufficient documentation

## 2020-05-29 DIAGNOSIS — I252 Old myocardial infarction: Secondary | ICD-10-CM | POA: Insufficient documentation

## 2020-05-29 DIAGNOSIS — R197 Diarrhea, unspecified: Secondary | ICD-10-CM | POA: Diagnosis not present

## 2020-05-29 DIAGNOSIS — Z955 Presence of coronary angioplasty implant and graft: Secondary | ICD-10-CM | POA: Diagnosis not present

## 2020-05-29 DIAGNOSIS — I10 Essential (primary) hypertension: Secondary | ICD-10-CM | POA: Insufficient documentation

## 2020-05-29 DIAGNOSIS — Z7982 Long term (current) use of aspirin: Secondary | ICD-10-CM | POA: Insufficient documentation

## 2020-05-29 DIAGNOSIS — Z79899 Other long term (current) drug therapy: Secondary | ICD-10-CM | POA: Diagnosis not present

## 2020-05-29 DIAGNOSIS — R112 Nausea with vomiting, unspecified: Secondary | ICD-10-CM | POA: Insufficient documentation

## 2020-05-30 ENCOUNTER — Emergency Department (HOSPITAL_COMMUNITY): Payer: No Typology Code available for payment source

## 2020-05-30 ENCOUNTER — Other Ambulatory Visit: Payer: Self-pay

## 2020-05-30 ENCOUNTER — Encounter (HOSPITAL_COMMUNITY): Payer: Self-pay | Admitting: *Deleted

## 2020-05-30 ENCOUNTER — Emergency Department (HOSPITAL_COMMUNITY)
Admission: EM | Admit: 2020-05-30 | Discharge: 2020-05-30 | Disposition: A | Discharge status: Home / Self Care | Payer: No Typology Code available for payment source | Attending: Emergency Medicine | Admitting: Emergency Medicine

## 2020-05-30 DIAGNOSIS — E860 Dehydration: Secondary | ICD-10-CM

## 2020-05-30 DIAGNOSIS — N400 Enlarged prostate without lower urinary tract symptoms: Secondary | ICD-10-CM

## 2020-05-30 DIAGNOSIS — R112 Nausea with vomiting, unspecified: Secondary | ICD-10-CM

## 2020-05-30 LAB — URINALYSIS, ROUTINE W REFLEX MICROSCOPIC
Bacteria, UA: NONE SEEN
Bilirubin Urine: NEGATIVE
Glucose, UA: NEGATIVE mg/dL
Hgb urine dipstick: NEGATIVE
Ketones, ur: 5 mg/dL — AB
Leukocytes,Ua: NEGATIVE
Nitrite: NEGATIVE
Protein, ur: 100 mg/dL — AB
Specific Gravity, Urine: 1.028 (ref 1.005–1.030)
pH: 5 (ref 5.0–8.0)

## 2020-05-30 LAB — COMPREHENSIVE METABOLIC PANEL
ALT: 28 U/L (ref 0–44)
AST: 36 U/L (ref 15–41)
Albumin: 4 g/dL (ref 3.5–5.0)
Alkaline Phosphatase: 51 U/L (ref 38–126)
Anion gap: 13 (ref 5–15)
BUN: 19 mg/dL (ref 8–23)
CO2: 22 mmol/L (ref 22–32)
Calcium: 9.7 mg/dL (ref 8.9–10.3)
Chloride: 103 mmol/L (ref 98–111)
Creatinine, Ser: 1.69 mg/dL — ABNORMAL HIGH (ref 0.61–1.24)
GFR calc Af Amer: 45 mL/min — ABNORMAL LOW (ref >60)
GFR calc non Af Amer: 39 mL/min — ABNORMAL LOW (ref >60)
Glucose, Bld: 118 mg/dL — ABNORMAL HIGH (ref 70–99)
Potassium: 3.6 mmol/L (ref 3.5–5.1)
Sodium: 138 mmol/L (ref 135–145)
Total Bilirubin: 1.1 mg/dL (ref 0.3–1.2)
Total Protein: 7.2 g/dL (ref 6.5–8.1)

## 2020-05-30 LAB — CBC
HCT: 52.7 % — ABNORMAL HIGH (ref 39.0–52.0)
Hemoglobin: 18 g/dL — ABNORMAL HIGH (ref 13.0–17.0)
MCH: 31.7 pg (ref 26.0–34.0)
MCHC: 34.2 g/dL (ref 30.0–36.0)
MCV: 92.8 fL (ref 80.0–100.0)
Platelets: 244 10*3/uL (ref 150–400)
RBC: 5.68 MIL/uL (ref 4.22–5.81)
RDW: 12.6 % (ref 11.5–15.5)
WBC: 10.3 10*3/uL (ref 4.0–10.5)
nRBC: 0 % (ref 0.0–0.2)

## 2020-05-30 LAB — LIPASE, BLOOD: Lipase: 29 U/L (ref 11–51)

## 2020-05-30 MED ORDER — MORPHINE SULFATE (PF) 4 MG/ML IV SOLN
4.0000 mg | Freq: Once | INTRAVENOUS | Status: AC
  Administered 2020-05-30: 4 mg via INTRAVENOUS
  Filled 2020-05-30: qty 1

## 2020-05-30 MED ORDER — ONDANSETRON 4 MG PO TBDP: 4.0000 mg | ORAL_TABLET | Freq: Three times a day (TID) | ORAL | 0 refills | Status: AC | PRN

## 2020-05-30 MED ORDER — SODIUM CHLORIDE 0.9% FLUSH: 3.0000 mL | Freq: Once | INTRAVENOUS | Status: DC

## 2020-05-30 MED ORDER — SODIUM CHLORIDE 0.9 % IV BOLUS
1000.0000 mL | Freq: Once | INTRAVENOUS | Status: AC
  Administered 2020-05-30: 1000 mL via INTRAVENOUS

## 2020-05-30 MED ORDER — IOHEXOL 300 MG/ML  SOLN
75.0000 mL | Freq: Once | INTRAMUSCULAR | Status: AC | PRN
  Administered 2020-05-30: 75 mL via INTRAVENOUS

## 2020-05-30 MED ORDER — ONDANSETRON HCL 4 MG/2ML IJ SOLN
4.0000 mg | Freq: Once | INTRAMUSCULAR | Status: AC
  Administered 2020-05-30: 4 mg via INTRAVENOUS
  Filled 2020-05-30: qty 2

## 2020-05-30 NOTE — ED Provider Notes (Signed)
[facility] EMERGENCY DEPARTMENT Provider Note   CSN: [id number] Arrival date & time: 05/29/20  2320     History Chief Complaint  Patient presents with   Emesis    Clayton Moore is a 74 y.o. male.  Pt presents to the ED today with n/v/d.  The pt said he's been sick for 3 days.  He has abdominal pain now from vomiting so much.  He's been unable to keep down any fluids.  No fevers.        History reviewed. No pertinent past medical history.   BPH Past Medical History:  Diagnosis Date   Hypertension   Kidney stone   MI (myocardial infarction) ([place]) 2005   Past Surgical History:  Procedure Laterality Date   ARTHROSCOPIC REPAIR ACL   cardiac stents   cardiac stents   VASECTOMY   Medications   Current Outpatient Prescriptions on File Prior to Visit  Medication Sig Dispense Refill   aspirin chewable tablet 81 mg Take 1 tablet by mouth once daily.   b complex vitamins capsule Take 1 capsule by mouth once daily.   fenofibrate 48 MG tablet Take 1 tablet by mouth once daily.   levoFLOXacin (LEVAQUIN) 500 MG tablet Take 1 tablet (500 mg total) by mouth once daily for 10 days. 10 tablet 0   metoprolol tartrate (LOPRESSOR) 50 MG tablet Take 1 tablet by mouth 2 (two) times a day.   mv-mn-herbal #208-b-sitosterol 100 mg Tablet Take 1 tablet by mouth once daily.   phenazopyridine (PYRIDIUM) 100 MG tablet Take 1 tablet (100 mg total) by mouth 3 (three) times a day as needed for pain for up to 3 days. 6 tablet 0   pravastatin (PRAVACHOL) 40 MG tablet Take 1 tablet by mouth once daily.   raNITIdine (ZANTAC) 150 MG tablet Take 1 tablet by mouth once daily.   ranolazine (RANEXA) 500 MG 12 hr tablet Take 1 tablet by mouth 2 (two) times a day.   tamsulosin (FLOMAX) 0.4 mg Capsule, Ext Release 24 hr Take 0.4 mg by mouth 2 (two) times a day.     Family and Social History  family history is not on file. reports that he quit smoking about 6 weeks ago.  His smoking use included Cigarettes. He has never used smokeless tobacco. He reports that he drinks alcohol. He reports that he does not use illicit drugs.    There are no problems to display for this patient.   History reviewed. No pertinent surgical history.     No family history on file.  Social History   Tobacco Use   Smoking status: Never Smoker   Smokeless tobacco: Never Used  Substance Use Topics   Alcohol use: Yes   Drug use: Not on file    Home Medications Prior to Admission medications   Medication Sig Start Date End Date Taking? Authorizing Provider  aspirin 325 MG tablet Take 325 mg by mouth daily.    [provider]  ondansetron (ZOFRAN ODT) 4 MG disintegrating tablet Take 1 tablet (4 mg total) by mouth every 8 (eight) hours as needed. 05/30/20   Isla Pence, MD    Allergies    Patient has no known allergies.  Review of Systems   Review of Systems  Gastrointestinal: Positive for abdominal pain, diarrhea, nausea and vomiting.  All other systems reviewed and are negative.   Physical Exam Updated Vital Signs BP 138/72 (BP Location: Left Arm)    Pulse 65  Temp 98.4 F (36.9 C) (Oral)    Resp 18    SpO2 97%   Physical Exam Vitals and nursing note reviewed.  Constitutional:      Appearance: Normal appearance.  HENT:     Head: Normocephalic and atraumatic.     Right Ear: External ear normal.     Left Ear: External ear normal.     Nose: Nose normal.     Mouth/Throat:     Mouth: Mucous membranes are dry.  Eyes:     Extraocular Movements: Extraocular movements intact.     Conjunctiva/sclera: Conjunctivae normal.     Pupils: Pupils are equal, round, and reactive to light.  Cardiovascular:     Rate and Rhythm: Normal rate and regular rhythm.     Pulses: Normal pulses.     Heart sounds: Normal heart sounds.  Pulmonary:     Effort: Pulmonary effort is normal.     Breath sounds: Normal breath sounds.  Abdominal:     General: Abdomen is  flat. Bowel sounds are normal.     Palpations: Abdomen is soft.     Tenderness: There is abdominal tenderness.  Musculoskeletal:        General: Normal range of motion.     Cervical back: Normal range of motion and neck supple.  Skin:    General: Skin is warm.     Capillary Refill: Capillary refill takes less than 2 seconds.  Neurological:     General: No focal deficit present.     Mental Status: He is alert and oriented to person, place, and time.  Psychiatric:        Mood and Affect: Mood normal.        Behavior: Behavior normal.        Thought Content: Thought content normal.        Judgment: Judgment normal.     ED Results / Procedures / Treatments   Labs (all labs ordered are listed, but only abnormal results are displayed) Labs Reviewed  COMPREHENSIVE METABOLIC PANEL - Abnormal; Notable for the following components:      Result Value   Glucose, Bld 118 (*)    Creatinine, Ser 1.69 (*)    GFR calc non Af Amer 39 (*)    GFR calc Af Amer 45 (*)    All other components within normal limits  CBC - Abnormal; Notable for the following components:   Hemoglobin 18.0 (*)    HCT 52.7 (*)    All other components within normal limits  URINALYSIS, ROUTINE W REFLEX MICROSCOPIC - Abnormal; Notable for the following components:   Color, Urine AMBER (*)    Ketones, ur 5 (*)    Protein, ur 100 (*)    All other components within normal limits  LIPASE, BLOOD    EKG None  Radiology CT ABDOMEN PELVIS W CONTRAST  Result Date: 05/30/2020 CLINICAL DATA:  Vomiting since this past Wednesday. Unspecified abdominal pain. EXAM: CT ABDOMEN AND PELVIS WITH CONTRAST TECHNIQUE: Multidetector CT imaging of the abdomen and pelvis was performed using the standard protocol following bolus administration of intravenous contrast. CONTRAST:  21mL OMNIPAQUE IOHEXOL 300 MG/ML  SOLN COMPARISON:  None. FINDINGS: Lower chest: Limited visualization lower thorax demonstrates minimal subsegmental atelectasis,  left greater than right. Minimal dependent subpleural ground-glass atelectasis. No pleural effusion. Normal heart size. Coronary artery calcifications. No pericardial effusion. Hepatobiliary: Normal hepatic contour. Multiple hypoattenuating hepatic lesions are seen throughout the liver with dominant hypoattenuating lesion within the caudal aspect the  right lobe of the liver measuring 2.3 cm in diameter (image 46, series 3). Lesions greater than 1 cm demonstrate Hounsfield units compatible with hepatic cysts while subcentimeter hypoattenuating hepatic lesions are too small to accurately characterize though favored to represent additional hepatic cysts. No discrete worrisome hepatic lesions. There is a punctate (approximately 0.6 cm) radiopaque gallstone within otherwise normal-appearing gallbladder (image 38, series 3). No gallbladder wall thickening or pericholecystic stranding. No intra or extrahepatic biliary ductal dilatation. No ascites. Pancreas: Normal appearance of the pancreas. Spleen: Normal appearance of the spleen. Adrenals/Urinary Tract: There is symmetric enhancement and excretion of the bilateral kidneys. Hypoattenuating nonenhancing renal cysts are seen bilaterally with dominant partially exophytic cyst arising from the anterior superior aspect of the right kidney measuring 5 cm in diameter (image 24, series 8) and dominant partially exophytic left-sided renal cyst measuring 3.1 cm (image 15, series 8). Additional smaller subcentimeter hypoattenuating renal lesions are too small to accurately characterize though favored to represent additional renal cysts. No evidence of nephrolithiasis on this postcontrast examination. No urinary obstruction or perinephric stranding. Normal appearance of the bilateral adrenal glands. Normal appearance of the urinary bladder given degree of distention. Stomach/Bowel: Mild fluid distension of several loops of small bowel without associated bowel wall thickening and  without evidence of enteric obstruction. Normal appearance of the terminal ileum and appendix. No discrete areas of bowel wall thickening. No hiatal hernia. No pneumoperitoneum, pneumatosis or portal venous gas. Vascular/Lymphatic: Scattered atherosclerotic plaque within normal caliber abdominal aorta. The major branch vessels of the abdominal aorta appear patent on this non CTA examination. No bulky retroperitoneal, mesenteric, pelvic or inguinal lymph adenopathy. Reproductive: The prostate is enlarged with mass effect upon the undersurface of the urinary bladder (representative sagittal image 72, series 7). No free fluid the pelvic cul-de-sac. Several phleboliths are seen within the left hemipelvis. Other: There is a minimal amount of subcutaneous edema about the midline of the low back. Small bilateral mesenteric fat containing inguinal hernias. Surgical clip noted about the right common femoral artery, likely the sequela of previous arterial access procedure. Musculoskeletal: No acute or aggressive osseous abnormalities. A bone island is noted within the T8 vertebral body. Mild-to-moderate multilevel lumbar spine DDD, worse at L2-L3 with disc space height loss, endplate irregularity and small posteriorly directed disc osteophyte complex at this location. 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 IMPRESSION: 1. Mild fluid distension of several loops of small bowel without evidence of enteric obstruction, bowel wall thickening or mesenteric stranding, nonspecific though could be seen in the setting of an enteritis. 2. Otherwise, no explanation  for patient's abdominal pain and vomiting. 3. Cholelithiasis without evidence of cholecystitis. 4. Coronary artery calcifications. Aortic Atherosclerosis (ICD10-I70.0). 5. Prostatomegaly with mass effect on the undersurface of the urinary bladder. If not recently performed, further evaluation with DRE is advised. 6. 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 Electronically Signed   By: Simonne Come M.D.   On: 05/30/2020 09:56    Procedures Procedures (including critical care time)  Medications Ordered in ED Medications  sodium chloride flush (NS) 0.9 % injection 3 mL (has no administration in time range)  sodium chloride 0.9 % bolus 1,000 mL (0 mLs Intravenous Stopped 05/30/20 1028)  ondansetron (ZOFRAN) injection 4 mg (4 mg Intravenous Given 05/30/20 0828)  morphine 4 MG/ML injection 4 mg (4 mg Intravenous Given 05/30/20 0828)  iohexol (OMNIPAQUE) 300 MG/ML solution 75 mL (75 mLs Intravenous Contrast Given 05/30/20 0930)    ED Course  I have reviewed the triage vital signs and the nursing notes.  Pertinent labs & imaging results that were available during my care of the patient were reviewed by me and considered in my medical decision making (see chart for details).    MDM Rules/Calculators/A&P                          Pt is feeling much better.  He looks better.  He is able to tolerate po fluids.  He sees the [place] in [place].  He is instructed to f/u with his pcp and to return if worse.   Final Clinical Impression(s) / ED Diagnoses Final diagnoses:  Dehydration  Nausea vomiting and diarrhea  Enlarged prostate    Rx / DC Orders ED Discharge Orders         Ordered    ondansetron (ZOFRAN ODT) 4 MG disintegrating tablet  Every 8 hours PRN      Discontinue  Reprint     05/30/20 1051           Jacalyn Lefevre, MD 05/30/20 1054

## 2020-05-30 NOTE — ED Notes (Signed)
Micheline, grand daughter, [phone] would like an update when available

## 2020-05-30 NOTE — ED Notes (Signed)
Patient given discharge instructions. Questions were answered. Patient verbalized understanding of discharge instructions and care at home.  

## 2020-05-30 NOTE — ED Triage Notes (Signed)
The pt is c/o vomiting  Since Wednesday no pain   He does have   diarrhea

## 2021-07-19 IMAGING — CT CT ABD-PELV W/ CM
2 of 5 series · 13 of 46 positions shown, 15 images · IV contrast (omnipaque)
Comparison: None.

CLINICAL DATA: Vomiting since this past [REDACTED]. Unspecified
abdominal pain.

EXAM:
CT ABDOMEN AND PELVIS WITH CONTRAST
TECHNIQUE: Multidetector CT imaging of the abdomen and pelvis was performed
using the standard protocol following bolus administration of
intravenous contrast.
CONTRAST:  75mL OMNIPAQUE IOHEXOL 300 MG/ML  SOLN

[Series 3: abd/ pelvis 5.0 i30f 2 · axial · 0.82mm/px · z∈[+766,+1256]mm · 10 of 110 slices shown, 12 images]
[im 6/110  soft-tissue]
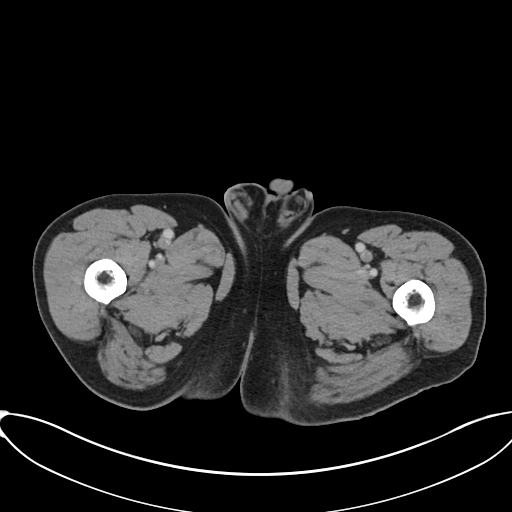
[im 6/110  bone]
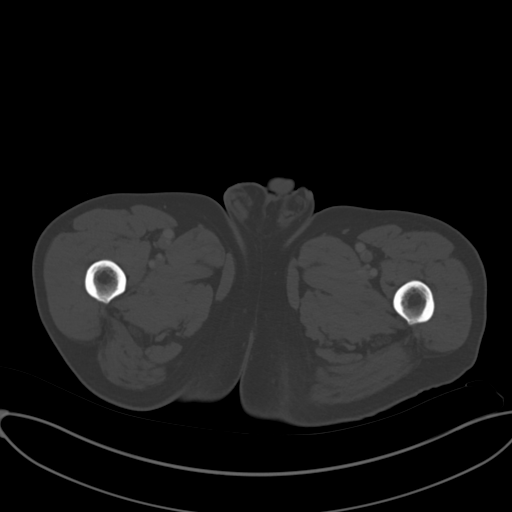
[im 18/110  soft-tissue]
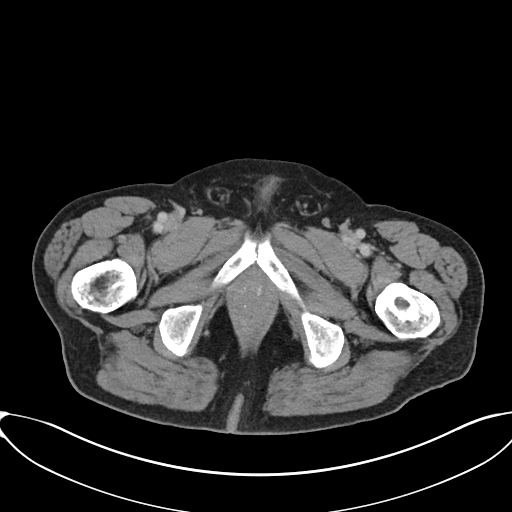
[im 29/110  soft-tissue]
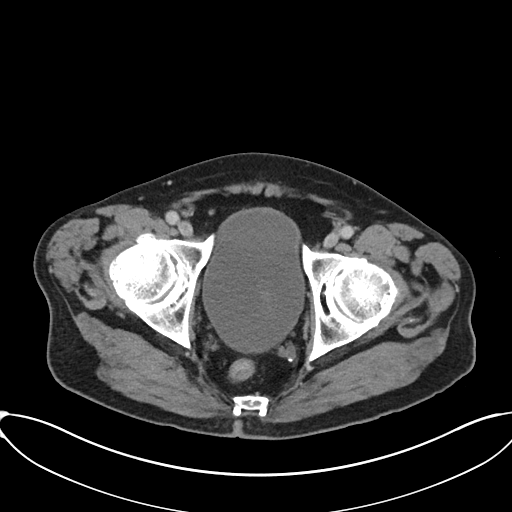
[im 41/110  soft-tissue]
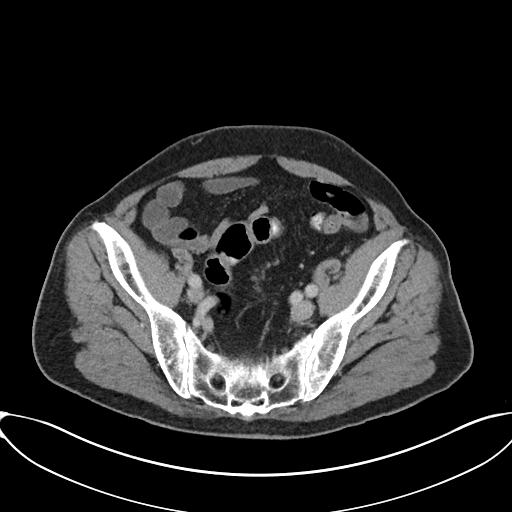
[im 52/110  soft-tissue]
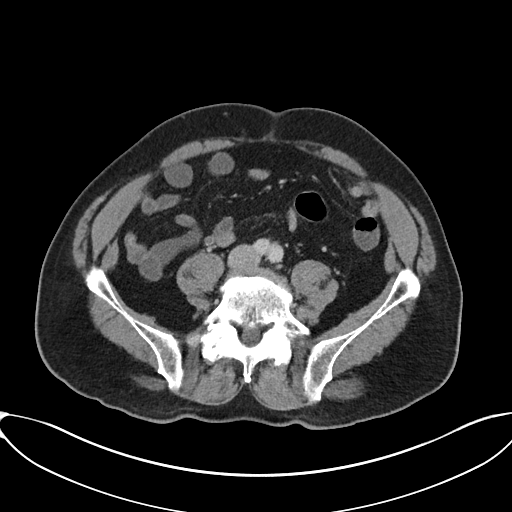
[im 58/110  soft-tissue]
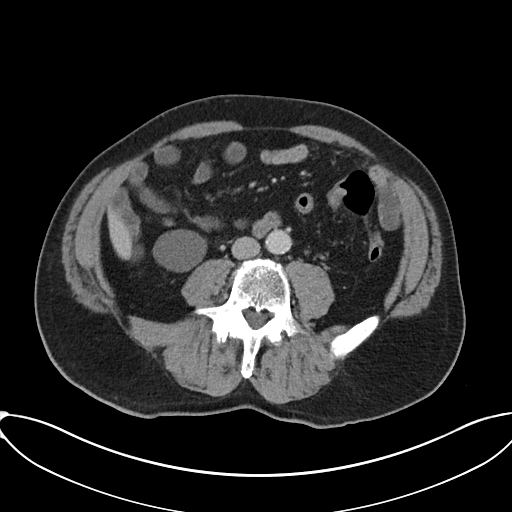
[im 69/110  soft-tissue]
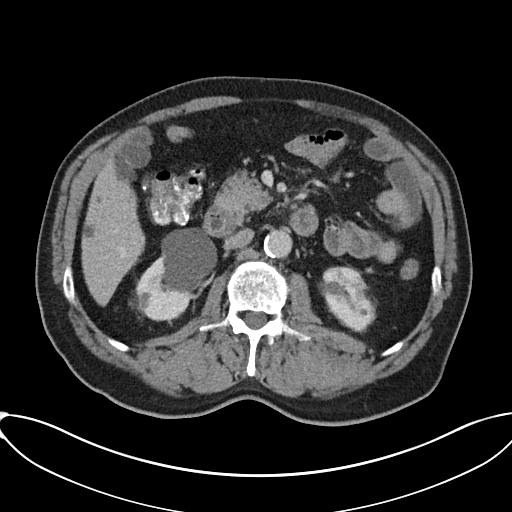
[im 81/110  soft-tissue]
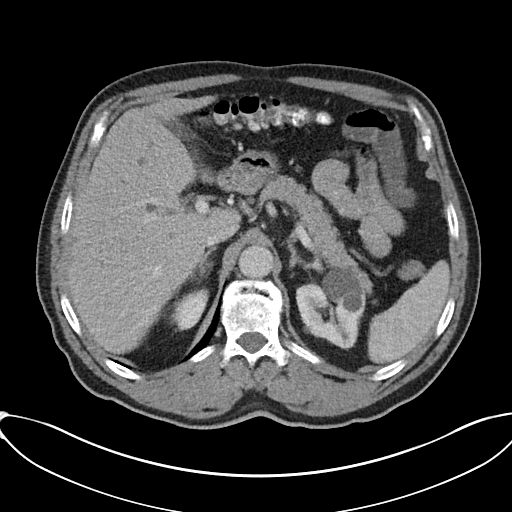
[im 92/110  soft-tissue]
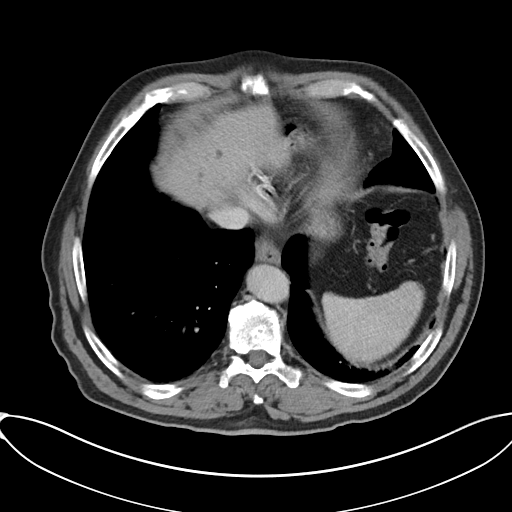
[im 92/110  bone]
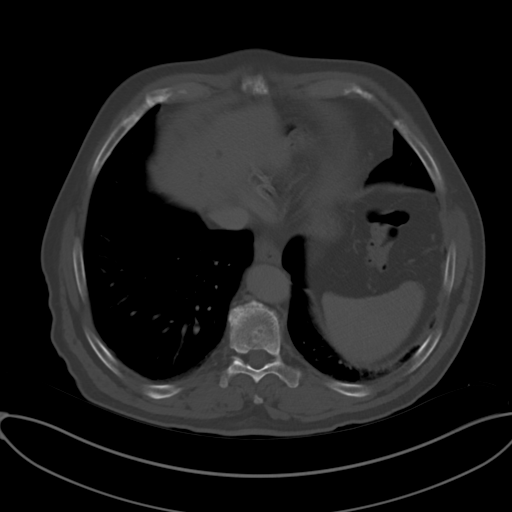
[im 104/110  soft-tissue]
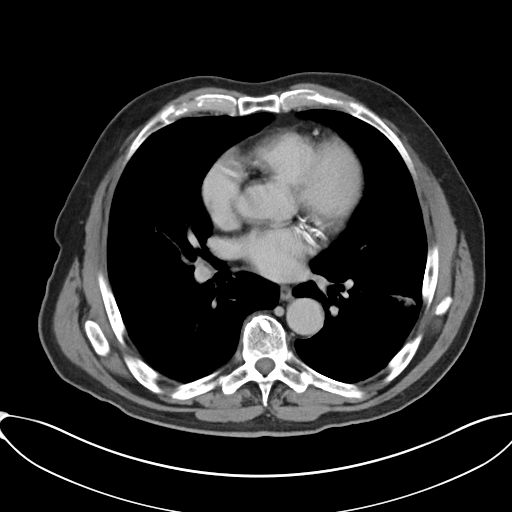

[Series 6: coronal soft tissue · coronal · 0.86mm/px · 3 of 118 slices shown]
[im 40/118  soft-tissue]
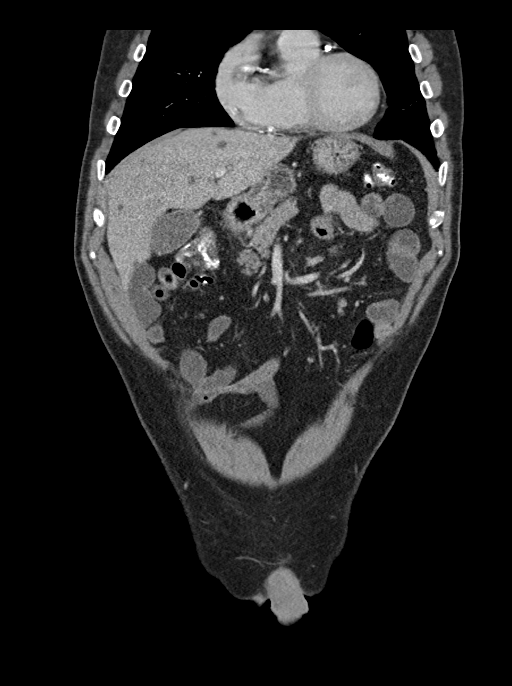
[im 53/118  soft-tissue]
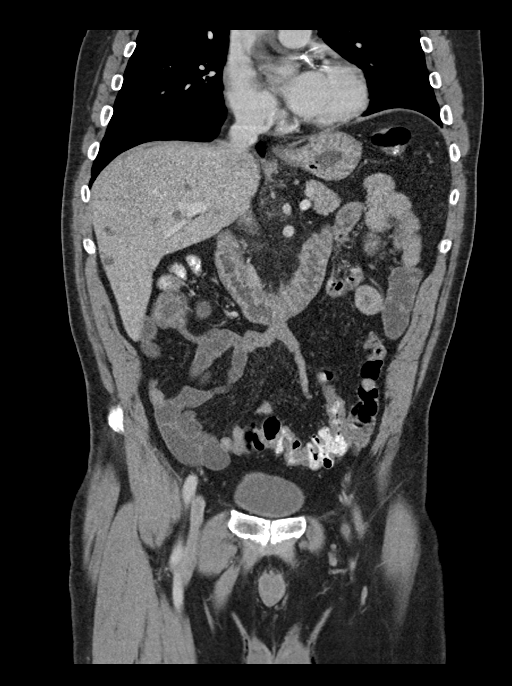
[im 66/118  soft-tissue]
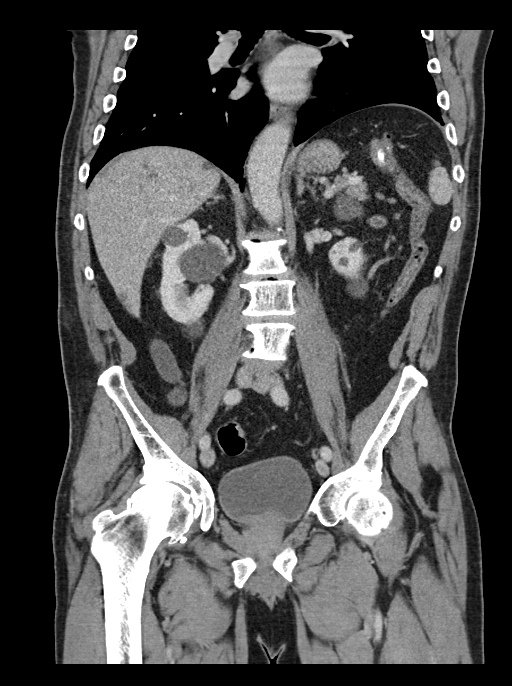

[13 of 46 positions shown; findings below may reference images not displayed]

FINDINGS: Lower chest: Limited visualization lower thorax demonstrates minimal
subsegmental atelectasis, left greater than right. Minimal dependent
subpleural ground-glass atelectasis. No pleural effusion.

Normal heart size. Coronary artery calcifications. No pericardial
effusion.

Hepatobiliary: Normal hepatic contour. Multiple hypoattenuating
hepatic lesions are seen throughout the liver with dominant
hypoattenuating lesion within the caudal aspect the right lobe of
the liver measuring 2.3 cm in diameter (image 46, series 3). Lesions
greater than 1 cm demonstrate Hounsfield units compatible with
hepatic cysts while subcentimeter hypoattenuating hepatic lesions
are too small to accurately characterize though favored to represent
additional hepatic cysts. No discrete worrisome hepatic lesions.
There is a punctate (approximately 0.6 cm) radiopaque gallstone
within otherwise normal-appearing gallbladder (image 38, series 3).
No gallbladder wall thickening or pericholecystic stranding. No
intra or extrahepatic biliary ductal dilatation. No ascites.

Pancreas: Normal appearance of the pancreas.

Spleen: Normal appearance of the spleen.

Adrenals/Urinary Tract: There is symmetric enhancement and excretion
of the bilateral kidneys. Hypoattenuating nonenhancing renal cysts
are seen bilaterally with dominant partially exophytic cyst arising
from the anterior superior aspect of the right kidney measuring 5 cm
in diameter (image 24, series 8) and dominant partially exophytic
left-sided renal cyst measuring 3.1 cm (image 15, series 8).
Additional smaller subcentimeter hypoattenuating renal lesions are
too small to accurately characterize though favored to represent
additional renal cysts. No evidence of nephrolithiasis on this
postcontrast examination. No urinary obstruction or perinephric
stranding.

Normal appearance of the bilateral adrenal glands.

Normal appearance of the urinary bladder given degree of distention.

Stomach/Bowel: Mild fluid distension of several loops of small bowel
without associated bowel wall thickening and without evidence of
enteric obstruction. Normal appearance of the terminal ileum and
appendix. No discrete areas of bowel wall thickening. No hiatal
hernia. No pneumoperitoneum, pneumatosis or portal venous gas.

Vascular/Lymphatic: Scattered atherosclerotic plaque within normal
caliber abdominal aorta. The major branch vessels of the abdominal
aorta appear patent on this non CTA examination.

No bulky retroperitoneal, mesenteric, pelvic or inguinal lymph
adenopathy.

Reproductive: The prostate is enlarged with mass effect upon the
undersurface of the urinary bladder (representative sagittal image
72, series 7). No free fluid the pelvic cul-de-sac. Several
phleboliths are seen within the left hemipelvis.

Other: There is a minimal amount of subcutaneous edema about the
midline of the low back. Small bilateral mesenteric fat containing
inguinal hernias. Surgical clip noted about the right common femoral
artery, likely the sequela of previous arterial access procedure.

Musculoskeletal: No acute or aggressive osseous abnormalities. A
bone island is noted within the T8 vertebral body. Mild-to-moderate
multilevel lumbar spine DDD, worse at L2-L3 with disc space height
loss, endplate irregularity and small posteriorly directed disc
osteophyte complex at this location. Moderate to severe degenerative
change of the bilateral hips, right greater than left, with joint
space loss, subchondral sclerosis and osteophytosis. Approximately
3.8 x 2.0 cm peripherally sclerotic lucent lesion within the inter
trochanteric region of the left femur (coronal image 77, series 6)
without associated periostitis or cortical disruption, potentially
representative of a small area of fibrous dysplasia.
IMPRESSION: 1. Mild fluid distension of several loops of small bowel without
evidence of enteric obstruction, bowel wall thickening or mesenteric
stranding, nonspecific though could be seen in the setting of an
enteritis.
2. Otherwise, no explanation for patient's abdominal pain and
vomiting.
3. Cholelithiasis without evidence of cholecystitis.
4. Coronary artery calcifications. Aortic Atherosclerosis
([id number]).
5. Prostatomegaly with mass effect on the undersurface of the
urinary bladder. If not recently performed, further evaluation with
GAGAN is advised.
6. Moderate to severe degenerative change the bilateral hips, right
greater than left, with approximately 3.8 cm peripherally sclerotic
lesion within the inter trochanteric region of the left femur,
nonspecific though potentially representative of a small area of
fibrous dysplasia.

## 2021-09-22 DIAGNOSIS — J209 Acute bronchitis, unspecified: Secondary | ICD-10-CM | POA: Diagnosis not present

## 2021-09-22 DIAGNOSIS — Z20822 Contact with and (suspected) exposure to covid-19: Secondary | ICD-10-CM | POA: Diagnosis not present

## 2021-09-22 DIAGNOSIS — R059 Cough, unspecified: Secondary | ICD-10-CM | POA: Diagnosis not present
